# Patient Record
Sex: Male | Born: 1980 | Race: White | Hispanic: No | Marital: Single | Smoking: Current every day smoker
Health system: Southern US, Community
[De-identification: ages and names within clinical notes are randomized; demographics above are authoritative.]

## PROBLEM LIST (undated history)

## (undated) DIAGNOSIS — G4700 Insomnia, unspecified: Secondary | ICD-10-CM

## (undated) DIAGNOSIS — F909 Attention-deficit hyperactivity disorder, unspecified type: Secondary | ICD-10-CM

## (undated) HISTORY — PX: CHOLECYSTECTOMY: SHX55

## (undated) HISTORY — PX: HERNIA REPAIR: SHX51

---

## 2015-07-17 ENCOUNTER — Emergency Department (HOSPITAL_COMMUNITY)
Admission: EM | Admit: 2015-07-17 | Discharge: 2015-07-17 | Disposition: A | Discharge status: Home / Self Care | Payer: No Typology Code available for payment source | Attending: Emergency Medicine | Admitting: Emergency Medicine

## 2015-07-17 ENCOUNTER — Emergency Department (HOSPITAL_COMMUNITY): Payer: No Typology Code available for payment source

## 2015-07-17 ENCOUNTER — Encounter (HOSPITAL_COMMUNITY): Payer: Self-pay

## 2015-07-17 DIAGNOSIS — S3992XA Unspecified injury of lower back, initial encounter: Secondary | ICD-10-CM | POA: Insufficient documentation

## 2015-07-17 DIAGNOSIS — M545 Low back pain: Secondary | ICD-10-CM

## 2015-07-17 DIAGNOSIS — S4992XA Unspecified injury of left shoulder and upper arm, initial encounter: Secondary | ICD-10-CM | POA: Diagnosis not present

## 2015-07-17 DIAGNOSIS — F17200 Nicotine dependence, unspecified, uncomplicated: Secondary | ICD-10-CM | POA: Diagnosis not present

## 2015-07-17 DIAGNOSIS — Y998 Other external cause status: Secondary | ICD-10-CM | POA: Insufficient documentation

## 2015-07-17 DIAGNOSIS — Y92410 Unspecified street and highway as the place of occurrence of the external cause: Secondary | ICD-10-CM | POA: Insufficient documentation

## 2015-07-17 DIAGNOSIS — S199XXA Unspecified injury of neck, initial encounter: Secondary | ICD-10-CM | POA: Insufficient documentation

## 2015-07-17 DIAGNOSIS — M542 Cervicalgia: Secondary | ICD-10-CM

## 2015-07-17 DIAGNOSIS — Y9389 Activity, other specified: Secondary | ICD-10-CM | POA: Insufficient documentation

## 2015-07-17 MED ORDER — ACETAMINOPHEN 325 MG PO TABS
650.0000 mg | ORAL_TABLET | Freq: Once | ORAL | Status: AC
  Administered 2015-07-17: 650 mg via ORAL
  Filled 2015-07-17: qty 2

## 2015-07-17 MED ORDER — IBUPROFEN 800 MG PO TABS
800.0000 mg | ORAL_TABLET | Freq: Once | ORAL | Status: AC
  Administered 2015-07-17: 800 mg via ORAL
  Filled 2015-07-17: qty 1

## 2015-07-17 MED ORDER — CYCLOBENZAPRINE HCL 10 MG PO TABS: 10.0000 mg | ORAL_TABLET | Freq: Every day | ORAL | Status: AC

## 2015-07-17 MED ORDER — IBUPROFEN 800 MG PO TABS
800.0000 mg | ORAL_TABLET | Freq: Three times a day (TID) | ORAL | Status: DC
Start: 2015-07-17 — End: 2024-04-04

## 2015-07-17 MED ORDER — HYDROCODONE-ACETAMINOPHEN 5-325 MG PO TABS: 1.0000 | ORAL_TABLET | ORAL | Status: DC | PRN
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
## 2015-07-17 NOTE — Discharge Instructions (Signed)
Back Exercises °The following exercises strengthen the muscles that help to support the back. They also help to keep the lower back flexible. Doing these exercises can help to prevent back pain or lessen existing pain. °If you have back pain or discomfort, try doing these exercises 2-3 times each day or as told by your health care provider. When the pain goes away, do them once each day, but increase the number of times that you repeat the steps for each exercise (do more repetitions). If you do not have back pain or discomfort, do these exercises once each day or as told by your health care provider. °EXERCISES °Single Knee to Chest °Repeat these steps 3-5 times for each leg: °· Lie on your back on a firm bed or the floor with your legs extended. °· Bring one knee to your chest. Your other leg should stay extended and in contact with the floor. °· Hold your knee in place by grabbing your knee or thigh. °· Pull on your knee until you feel a gentle stretch in your lower back. °· Hold the stretch for 10-30 seconds. °· Slowly release and straighten your leg. °Pelvic Tilt °Repeat these steps 5-10 times: °· Lie on your back on a firm bed or the floor with your legs extended. °· Bend your knees so they are pointing toward the ceiling and your feet are flat on the floor. °· Tighten your lower abdominal muscles to press your lower back against the floor. This motion will tilt your pelvis so your tailbone points up toward the ceiling instead of pointing to your feet or the floor. °· With gentle tension and even breathing, hold this position for 5-10 seconds. °Cat-Cow °Repeat these steps until your lower back becomes more flexible: °· Get into a hands-and-knees position on a firm surface. Keep your hands under your shoulders, and keep your knees under your hips. You may place padding under your knees for comfort. °· Let your head hang down, and point your tailbone toward the floor so your lower back becomes rounded like the  back of a cat. °· Hold this position for 5 seconds. °· Slowly lift your head and point your tailbone up toward the ceiling so your back forms a sagging arch like the back of a cow. °· Hold this position for 5 seconds. °Press-Ups °Repeat these steps 5-10 times: °1. Lie on your abdomen (face-down) on the floor. °2. Place your palms near your head, about shoulder-width apart. °3. While you keep your back as relaxed as possible and keep your hips on the floor, slowly straighten your arms to raise the top half of your body and lift your shoulders. Do not use your back muscles to raise your upper torso. You may adjust the placement of your hands to make yourself more comfortable. °4. Hold this position for 5 seconds while you keep your back relaxed. °5. Slowly return to lying flat on the floor. °Bridges °Repeat these steps 10 times: °1. Lie on your back on a firm surface. °2. Bend your knees so they are pointing toward the ceiling and your feet are flat on the floor. °3. Tighten your buttocks muscles and lift your buttocks off of the floor until your waist is at almost the same height as your knees. You should feel the muscles working in your buttocks and the back of your thighs. If you do not feel these muscles, slide your feet 1-2 inches farther away from your buttocks. °4. Hold this position for 3-5   seconds. °5. Slowly lower your hips to the starting position, and allow your buttocks muscles to relax completely. °If this exercise is too easy, try doing it with your arms crossed over your chest. °Abdominal Crunches °Repeat these steps 5-10 times: °1. Lie on your back on a firm bed or the floor with your legs extended. °2. Bend your knees so they are pointing toward the ceiling and your feet are flat on the floor. °3. Cross your arms over your chest. °4. Tip your chin slightly toward your chest without bending your neck. °5. Tighten your abdominal muscles and slowly raise your trunk (torso) high enough to lift your  shoulder blades a tiny bit off of the floor. Avoid raising your torso higher than that, because it can put too much stress on your low back and it does not help to strengthen your abdominal muscles. °6. Slowly return to your starting position. °Back Lifts °Repeat these steps 5-10 times: °1. Lie on your abdomen (face-down) with your arms at your sides, and rest your forehead on the floor. °2. Tighten the muscles in your legs and your buttocks. °3. Slowly lift your chest off of the floor while you keep your hips pressed to the floor. Keep the back of your head in line with the curve in your back. Your eyes should be looking at the floor. °4. Hold this position for 3-5 seconds. °5. Slowly return to your starting position. °SEEK MEDICAL CARE IF: °· Your back pain or discomfort gets much worse when you do an exercise. °· Your back pain or discomfort does not lessen within 2 hours after you exercise. °If you have any of these problems, stop doing these exercises right away. Do not do them again unless your health care provider says that you can. °SEEK IMMEDIATE MEDICAL CARE IF: °· You develop sudden, severe back pain. If this happens, stop doing the exercises right away. Do not do them again unless your health care provider says that you can. °  °This information is not intended to replace advice given to you by your health care provider. Make sure you discuss any questions you have with your health care provider. °  °Document Released: 06/28/2004 Document Revised: 02/09/2015 Document Reviewed: 07/15/2014 °Elsevier Interactive Patient Education ©2016 [employer] °Motor Vehicle Collision °It is common to have multiple bruises and sore muscles after a motor vehicle collision (MVC). These tend to feel worse for the first 24 hours. You may have the most stiffness and soreness over the first several hours. You may also feel worse when you wake up the first morning after your collision. After this point, you will usually  begin to improve with each day. The speed of improvement often depends on the severity of the collision, the number of injuries, and the location and nature of these injuries. °HOME CARE INSTRUCTIONS °· Put ice on the injured area. °¨ Put ice in a plastic bag. °¨ Place a towel between your skin and the bag. °¨ Leave the ice on for 15-20 minutes, 3-4 times a day, or as directed by your health care provider. °· Drink enough fluids to keep your urine clear or pale yellow. Do not drink alcohol. °· Take a warm shower or bath once or twice a day. This will increase blood flow to sore muscles. °· You may return to activities as directed by your caregiver. Be careful when lifting, as this may aggravate neck or back pain. °· Only take over-the-counter or prescription medicines for pain, discomfort,   or fever as directed by your caregiver. Do not use aspirin. This may increase bruising and bleeding. °SEEK IMMEDIATE MEDICAL CARE IF: °· You have numbness, tingling, or weakness in the arms or legs. °· You develop severe headaches not relieved with medicine. °· You have severe neck pain, especially tenderness in the middle of the back of your neck. °· You have changes in bowel or bladder control. °· There is increasing pain in any area of the body. °· You have shortness of breath, light-headedness, dizziness, or fainting. °· You have chest pain. °· You feel sick to your stomach (nauseous), throw up (vomit), or sweat. °· You have increasing abdominal discomfort. °· There is blood in your urine, stool, or vomit. °· You have pain in your shoulder (shoulder strap areas). °· You feel your symptoms are getting worse. °MAKE SURE YOU: °· Understand these instructions. °· Will watch your condition. °· Will get help right away if you are not doing well or get worse. °  °This information is not intended to replace advice given to you by your health care provider. Make sure you discuss any questions you have with your health care provider. °    °Document Released: 05/21/2005 Document Revised: 06/11/2014 Document Reviewed: 10/18/2010 °Elsevier Interactive Patient Education ©2016 [employer] ° °
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
## 2024-04-04 ENCOUNTER — Emergency Department (HOSPITAL_COMMUNITY): Payer: Self-pay

## 2024-04-04 ENCOUNTER — Other Ambulatory Visit: Payer: Self-pay

## 2024-04-04 ENCOUNTER — Emergency Department (HOSPITAL_COMMUNITY)
Admission: EM | Admit: 2024-04-04 | Discharge: 2024-04-04 | Disposition: A | Discharge status: Home / Self Care | Payer: Self-pay | Attending: Emergency Medicine | Admitting: Emergency Medicine

## 2024-04-04 ENCOUNTER — Encounter (HOSPITAL_COMMUNITY): Payer: Self-pay | Admitting: Emergency Medicine

## 2024-04-04 DIAGNOSIS — M51369 Other intervertebral disc degeneration, lumbar region without mention of lumbar back pain or lower extremity pain: Secondary | ICD-10-CM

## 2024-04-04 DIAGNOSIS — M545 Low back pain, unspecified: Secondary | ICD-10-CM

## 2024-04-04 DIAGNOSIS — M5136 Other intervertebral disc degeneration, lumbar region with discogenic back pain only: Secondary | ICD-10-CM | POA: Insufficient documentation

## 2024-04-04 HISTORY — DX: Attention-deficit hyperactivity disorder, unspecified type: F90.9

## 2024-04-04 HISTORY — DX: Insomnia, unspecified: G47.00

## 2024-04-04 MED ORDER — LIDOCAINE 5 % EX PTCH
1.0000 | MEDICATED_PATCH | CUTANEOUS | Status: DC
  Administered 2024-04-04: 1 via TRANSDERMAL
  Filled 2024-04-04: qty 1

## 2024-04-04 MED ORDER — LIDOCAINE 5 % EX PTCH: 1.0000 | MEDICATED_PATCH | CUTANEOUS | 0 refills | Status: AC

## 2024-04-04 MED ORDER — PREDNISONE 20 MG PO TABS: 40.0000 mg | ORAL_TABLET | Freq: Every day | ORAL | 0 refills | Status: AC

## 2024-04-04 MED ORDER — OXYCODONE HCL 5 MG PO TABS: 5.0000 mg | ORAL_TABLET | Freq: Four times a day (QID) | ORAL | 0 refills | Status: AC | PRN

## 2024-04-04 MED ORDER — HYDROMORPHONE HCL 1 MG/ML IJ SOLN
0.5000 mg | Freq: Once | INTRAMUSCULAR | Status: AC
  Administered 2024-04-04: 0.5 mg via INTRAVENOUS
  Filled 2024-04-04: qty 0.5

## 2024-04-04 MED ORDER — OXYCODONE HCL 5 MG PO TABS
5.0000 mg | ORAL_TABLET | Freq: Once | ORAL | Status: AC
  Administered 2024-04-04: 5 mg via ORAL
  Filled 2024-04-04: qty 1

## 2024-04-04 MED ORDER — METHOCARBAMOL 500 MG PO TABS: 500.0000 mg | ORAL_TABLET | Freq: Three times a day (TID) | ORAL | 0 refills | Status: AC | PRN

## 2024-04-04 MED ORDER — NAPROXEN 500 MG PO TABS: 500.0000 mg | ORAL_TABLET | Freq: Two times a day (BID) | ORAL | 0 refills | Status: AC

## 2024-04-04 NOTE — Discharge Instructions (Addendum)
 It was a pleasure taking care of you today.  Based on your history, physical exam, as well as imaging it appears you are safe for discharge.  Today on your CT scan it looks like you have 2 bulging disks in your lower back.  This is probably the reason for your pain.  Because of this you have been prescribed an anti-inflammatory medication, steroids, lidocaine patch, as well as a muscle relaxant.  I also have given you a few pills of oxycodone for breakthrough pain.  Please do not take your trazodone with the muscle relaxant or oxycodone as these medications are not compatible.  Please keep in mind that the oxycodone and Robaxin may make you sleepy, so please do not drive or operate heavy machinery after taking this medication.  Please do not take over-the-counter Ibuprofen  with naproxen as it is also an anti-inflammatory medication.  I have attached the phone number for neurosurgery who is a spine specialist, please schedule an appointment for next week if possible.  Please make your primary care provider aware of your workup and findings today and that you have been prescribed some new medications.

## 2024-04-04 NOTE — ED Triage Notes (Signed)
 Pt to the ED with complaints of back pain after a fall today. Pt states he slid from the bed to the floor.Pt first injured his back last week following lifting.  Pt was given 30 mg or toradal in route with [facility].

## 2024-04-04 NOTE — ED Notes (Signed)
 Patient transported to CT

## 2024-04-04 NOTE — ED Provider Notes (Signed)
 [place] EMERGENCY DEPARTMENT AT [facility] Provider Note   CSN: [id number] Arrival date & time: 04/04/24  8371     Patient presents with: Back Pain   Luis Dunn is a 43 y.o. male presents to the emergency department with a chief complaint of very severe lower back pain.  Patient denies known trauma or injury.  Patient was brought in via an EMS and had slight improvement after Toradol en route.  Patient states that he was lifting up a tube TV approximately 1 week ago and did not notice any injury at this time however the next day he did begin to have mild back pain.  The next morning is when the pain became very severe to the point where it was hard to get out of bed.  Patient states that he does have some slight improvement with pain once he is up and walking.  Patient denies previous history of back surgeries or back pain like this.  Past medical history significant for ADHD, insomnia, etc. Denies groin numbness/tingling, urinary/bowel incontinence or retention, history of IV drug use, radiating numbness/tingling, fever or chills.     Back Pain      Prior to Admission medications   Medication Sig Start Date End Date Taking? Authorizing Provider  lidocaine (LIDODERM) 5 % Place 1 patch onto the skin daily. Remove & Discard patch within 12 hours or as directed by MD 04/04/24  Yes Addalee Kavanagh F, PA-C  methocarbamol (ROBAXIN) 500 MG tablet Take 1 tablet (500 mg total) by mouth every 8 (eight) hours as needed for muscle spasms. 04/04/24  Yes Joani Cosma F, PA-C  naproxen (NAPROSYN) 500 MG tablet Take 1 tablet (500 mg total) by mouth 2 (two) times daily for 14 days. 04/04/24 04/18/24 Yes Lalania Haseman F, PA-C  oxyCODONE (ROXICODONE) 5 MG immediate release tablet Take 1 tablet (5 mg total) by mouth every 6 (six) hours as needed for severe pain (pain score 7-10). 04/04/24  Yes Hasheem Voland F, PA-C  predniSONE (DELTASONE) 20 MG tablet Take 2 tablets (40 mg total) by mouth  daily for 5 days. 04/04/24 04/09/24 Yes Farzad Tibbetts F, PA-C  cyclobenzaprine  (FLEXERIL ) 10 MG tablet Take 1 tablet (10 mg total) by mouth at bedtime. 07/17/15   Rose, Kayla, PA-C    Allergies: Patient has no known allergies.    Review of Systems  Musculoskeletal:  Positive for back pain.    Updated Vital Signs BP 122/62 (BP Location: Right Arm)   Pulse 62   Temp 98 F (36.7 C) (Oral)   Resp 18   Ht 6' 3 (1.905 m)   Wt 131.5 kg   SpO2 100%   BMI 36.25 kg/m   Physical Exam Vitals and nursing note reviewed.  Constitutional:      General: He is awake. He is not in acute distress.    Appearance: Normal appearance. He is obese. He is not ill-appearing, toxic-appearing or diaphoretic.  HENT:     Head: Normocephalic and atraumatic.  Eyes:     General: No scleral icterus. Cardiovascular:     Rate and Rhythm: Normal rate and regular rhythm.  Pulmonary:     Effort: Pulmonary effort is normal. No respiratory distress.  Musculoskeletal:     Comments: Normal strength with plantar and dorsiflexion of bilateral ankles of lower extremity, no cervical or thoracic spinous tenderness, mild tenderness of the lower lumbar spine, no paraspinal muscle tenderness  Extreme pain when attempting to sit up in bed to the point  where the patient began screaming due to the level of pain  Pain bilaterally with straight leg raise test  No reduced strength in lower extremities appreciated during exam, patient ambulatory without assistance with discomfort  Skin:    General: Skin is warm.     Capillary Refill: Capillary refill takes less than 2 seconds.     Comments: No overlying skin changes of low back  Neurological:     General: No focal deficit present.     Mental Status: He is alert and oriented to person, place, and time.  Psychiatric:        Mood and Affect: Mood normal.        Behavior: Behavior normal. Behavior is cooperative.     (all labs ordered are listed, but only abnormal results  are displayed) Labs Reviewed - No data to display  EKG: None  Radiology: CT Lumbar Spine Wo Contrast Result Date: 04/04/2024 EXAM: CT OF THE LUMBAR SPINE WITHOUT CONTRAST 04/04/2024 05:53:40 PM TECHNIQUE: CT of the lumbar spine was performed without the administration of intravenous contrast. Multiplanar reformatted images are provided for review. Automated exposure control, iterative reconstruction, and/or weight based adjustment of the mA/kV was utilized to reduce the radiation dose to as low as reasonably achievable. COMPARISON: Radiographs dated 07/17/2015. CLINICAL HISTORY: Lumbar back pain after a fall today. Prior lifting injury of the back last week. FINDINGS: BONES AND ALIGNMENT: Normal vertebral body heights. No acute fracture or suspicious bone lesion. Normal alignment. The lowest lumbar type non-weightbearing vertebra is labeled as L5. DEGENERATIVE CHANGES: L1: Unremarkable. L1-L2: Unremarkable. L2-L3: Unremarkable. L3-L4: Unremarkable. L4-L5: No impingement. Mild disc bulge. L5-S1: Potential mild to moderate right subarticular lateral recess stenosis due to disc protrusion, image 141 series 9. Mild disc bulge. SOFT TISSUES: Cholecystectomy. No acute abnormality. IMPRESSION: 1. Mild disc bulge at L5-S1 with potential mild to moderate right subarticular lateral recess stenosis due to disc protrusion. 2. Mild disc bulge at L4-L5 without impingement. Electronically signed by: Ryan Salvage MD 04/04/2024 05:59 PM EDT RP Workstation: [id number]     Procedures   Medications Ordered in the ED  lidocaine (LIDODERM) 5 % 1 patch (1 patch Transdermal Patch Applied 04/04/24 1741)  HYDROmorphone (DILAUDID) injection 0.5 mg (0.5 mg Intravenous Given 04/04/24 1740)  oxyCODONE (Oxy IR/ROXICODONE) immediate release tablet 5 mg (5 mg Oral Given 04/04/24 1928)                                    Medical Decision Making Amount and/or Complexity of Data Reviewed Radiology:  ordered.  Risk Prescription drug management.   Patient presents to the ED for concern of low back pain, this involves an extensive number of treatment options, and is a complaint that carries with it a high risk of complications and morbidity.  The differential diagnosis includes bulging disc, spinal stenosis, cauda equina syndrome, muscle sprain/strain, etc.   Co morbidities that complicate the patient evaluation  History of ADHD, sleep disturbance   Additional history obtained:  EMS note   Imaging Studies ordered:  I ordered imaging studies including CT lumbar spine I independently visualized and interpreted imaging which showed: Mild disc bulge at L5-S1 with potential mild to moderate lateral recess stenosis due to disc protrusion, mild disc bulge at L4-5 without impingement I agree with the radiologist interpretation   Medicines ordered and prescription drug management:  I ordered medication including Dilaudid, oxycodone for pain, lidocaine patch for  pain Reevaluation of the patient after these medicines showed that the patient improved I have reviewed the patients home medicines and have made adjustments as needed   Test Considered:  MRI lumbar spine: Declined at this time as patient is ambulatory without assistance however does have some discomfort, no groin numbness/tingling, no history of IV drug use, no bowel/bladder incontinence or retention, low clinical suspicion for cauda equina syndrome, spinous abscess, or other acute life-threatening condition   Critical Interventions:  None   Problem List / ED Course:  43 year old male, vital signs stable, presents to the emergency department for chief complaint of back pain via EMS, unsure of exact trauma/injury but symptoms started after lifting a tube TV approximately 1 week ago, symptoms have progressed to the point where patient is experiencing severe pain and is limiting daily life On physical exam patient in  severe pain with any type of bending or flexing movement, patient screamed when attempting to sit up in bed, mild tenderness with palpation of lumbar spine however pain much worse with movement, lower extremities neurovascularly intact, low clinical suspicion for cauda equina syndrome, patient has no history of IV drug use Will obtain CT imaging of lumbar spine due to patient's level of pain and debilitation CT significant for bulging disc at L4-5 and L5-S1 with other changes Patient educated on workup On reassessment patient improved after pain medication here in the emergency department, patient ambulatory without assistance however still maintaining a moderate level of discomfort, patient given 1 last dose of oxycodone prior to discharge Return precautions given Patient discharged with symptomatic treatment outpatient and information for neurosurgery follow-up, instructed to make primary care provider aware of workup and findings today Most likely diagnosis at this time is bulging disc in lumbar spine, low clinical suspicion for acute life-threatening process at this time as patient's vital signs are stable, patient is afebrile, patient ambulatory without assistance prior to discharge   Reevaluation:  After the interventions noted above, I reevaluated the patient and found that they have :improved   Social Determinants of Health:  none   Dispostion:  After consideration of the diagnostic results and the patients response to treatment, I feel that the patent would benefit from discharge and outpatient therapy as described, follow-up with neurosurgery.       Final diagnoses:  Acute midline low back pain without sciatica  Bulging of lumbar intervertebral disc    ED Discharge Orders          Ordered    oxyCODONE (ROXICODONE) 5 MG immediate release tablet  Every 6 hours PRN        04/04/24 1913    predniSONE (DELTASONE) 20 MG tablet  Daily        04/04/24 1913    naproxen  (NAPROSYN) 500 MG tablet  2 times daily        04/04/24 1913    lidocaine (LIDODERM) 5 %  Every 24 hours        04/04/24 1913    methocarbamol (ROBAXIN) 500 MG tablet  Every 8 hours PRN        04/04/24 1913               Jadyn Brasher F, PA-C 04/04/24 2110    Suzette Pac, MD 04/05/24 1040
